# Patient Record
Sex: Female | Born: 1960 | Race: White | Hispanic: No | Marital: Married | Smoking: Never smoker
Health system: Southern US, Community
[De-identification: ages and names within clinical notes are randomized; demographics above are authoritative.]

## PROBLEM LIST (undated history)

## (undated) DIAGNOSIS — M791 Myalgia, unspecified site: Secondary | ICD-10-CM

## (undated) DIAGNOSIS — F32A Depression, unspecified: Secondary | ICD-10-CM

## (undated) DIAGNOSIS — Z9889 Other specified postprocedural states: Secondary | ICD-10-CM

## (undated) DIAGNOSIS — F329 Major depressive disorder, single episode, unspecified: Secondary | ICD-10-CM

## (undated) DIAGNOSIS — R61 Generalized hyperhidrosis: Secondary | ICD-10-CM

## (undated) DIAGNOSIS — R5383 Other fatigue: Secondary | ICD-10-CM

## (undated) DIAGNOSIS — R233 Spontaneous ecchymoses: Secondary | ICD-10-CM

## (undated) DIAGNOSIS — R238 Other skin changes: Secondary | ICD-10-CM

## (undated) DIAGNOSIS — R609 Edema, unspecified: Secondary | ICD-10-CM

## (undated) HISTORY — DX: Spontaneous ecchymoses: R23.3

## (undated) HISTORY — DX: Edema, unspecified: R60.9

## (undated) HISTORY — DX: Generalized hyperhidrosis: R61

## (undated) HISTORY — DX: Other specified postprocedural states: Z98.890

## (undated) HISTORY — DX: Myalgia, unspecified site: M79.10

## (undated) HISTORY — DX: Depression, unspecified: F32.A

## (undated) HISTORY — PX: BREAST SURGERY: SHX581

## (undated) HISTORY — PX: OTHER SURGICAL HISTORY: SHX169

## (undated) HISTORY — PX: BREAST EXCISIONAL BIOPSY: SUR124

## (undated) HISTORY — DX: Other fatigue: R53.83

## (undated) HISTORY — DX: Other skin changes: R23.8

## (undated) HISTORY — DX: Major depressive disorder, single episode, unspecified: F32.9

---

## 2003-07-07 ENCOUNTER — Encounter (HOSPITAL_COMMUNITY): Admission: RE | Admit: 2003-07-07 | Discharge: 2003-10-05 | Payer: Self-pay | Admitting: Unknown Physician Specialty

## 2003-07-07 ENCOUNTER — Encounter: Payer: Self-pay | Admitting: Unknown Physician Specialty

## 2003-07-08 ENCOUNTER — Encounter: Payer: Self-pay | Admitting: Unknown Physician Specialty

## 2003-07-08 ENCOUNTER — Encounter: Admission: RE | Admit: 2003-07-08 | Discharge: 2003-07-08 | Payer: Self-pay | Admitting: Unknown Physician Specialty

## 2003-08-26 ENCOUNTER — Encounter: Payer: Self-pay | Admitting: Unknown Physician Specialty

## 2004-08-02 ENCOUNTER — Encounter: Admission: RE | Admit: 2004-08-02 | Discharge: 2004-08-02 | Payer: Self-pay | Admitting: Obstetrics and Gynecology

## 2005-09-15 ENCOUNTER — Encounter: Admission: RE | Admit: 2005-09-15 | Discharge: 2005-09-15 | Payer: Self-pay | Admitting: Obstetrics and Gynecology

## 2006-11-02 ENCOUNTER — Encounter: Admission: RE | Admit: 2006-11-02 | Discharge: 2006-11-02 | Payer: Self-pay | Admitting: Obstetrics and Gynecology

## 2006-11-13 ENCOUNTER — Encounter: Admission: RE | Admit: 2006-11-13 | Discharge: 2006-11-13 | Payer: Self-pay | Admitting: Obstetrics and Gynecology

## 2008-06-17 ENCOUNTER — Encounter: Admission: RE | Admit: 2008-06-17 | Discharge: 2008-06-17 | Payer: Self-pay | Admitting: Obstetrics and Gynecology

## 2008-06-23 ENCOUNTER — Encounter: Admission: RE | Admit: 2008-06-23 | Discharge: 2008-06-23 | Payer: Self-pay | Admitting: Obstetrics and Gynecology

## 2010-08-17 ENCOUNTER — Encounter: Admission: RE | Admit: 2010-08-17 | Discharge: 2010-08-17 | Payer: Self-pay | Admitting: Obstetrics and Gynecology

## 2011-08-29 ENCOUNTER — Other Ambulatory Visit: Payer: Self-pay | Admitting: Obstetrics and Gynecology

## 2011-08-29 DIAGNOSIS — Z1231 Encounter for screening mammogram for malignant neoplasm of breast: Secondary | ICD-10-CM

## 2011-09-26 ENCOUNTER — Ambulatory Visit: Payer: Self-pay

## 2011-09-27 ENCOUNTER — Ambulatory Visit
Admission: RE | Admit: 2011-09-27 | Discharge: 2011-09-27 | Disposition: A | Discharge status: Home / Self Care | Payer: BC Managed Care – PPO | Source: Ambulatory Visit | Attending: Obstetrics and Gynecology | Admitting: Obstetrics and Gynecology

## 2011-09-27 DIAGNOSIS — Z1231 Encounter for screening mammogram for malignant neoplasm of breast: Secondary | ICD-10-CM

## 2012-09-20 ENCOUNTER — Other Ambulatory Visit: Payer: Self-pay | Admitting: Internal Medicine

## 2012-09-20 DIAGNOSIS — Z1231 Encounter for screening mammogram for malignant neoplasm of breast: Secondary | ICD-10-CM

## 2012-10-19 ENCOUNTER — Ambulatory Visit
Admission: RE | Admit: 2012-10-19 | Discharge: 2012-10-19 | Disposition: A | Discharge status: Home / Self Care | Payer: BC Managed Care – PPO | Source: Ambulatory Visit | Attending: Internal Medicine | Admitting: Internal Medicine

## 2012-10-19 DIAGNOSIS — Z1231 Encounter for screening mammogram for malignant neoplasm of breast: Secondary | ICD-10-CM

## 2013-09-19 ENCOUNTER — Encounter (INDEPENDENT_AMBULATORY_CARE_PROVIDER_SITE_OTHER): Payer: Self-pay

## 2013-09-19 ENCOUNTER — Ambulatory Visit (INDEPENDENT_AMBULATORY_CARE_PROVIDER_SITE_OTHER): Payer: BC Managed Care – PPO

## 2013-09-19 VITALS — BP 113/64 | HR 86 | Resp 16

## 2013-09-19 DIAGNOSIS — M792 Neuralgia and neuritis, unspecified: Secondary | ICD-10-CM

## 2013-09-19 DIAGNOSIS — IMO0002 Reserved for concepts with insufficient information to code with codable children: Secondary | ICD-10-CM

## 2013-09-19 DIAGNOSIS — R609 Edema, unspecified: Secondary | ICD-10-CM

## 2013-09-19 NOTE — Patient Instructions (Signed)
Edema Edema is an abnormal build-up of fluids in tissues. Because this is partly dependent on gravity (water flows to the lowest place), it is more common in the legs and thighs (lower extremities). It is also common in the looser tissues, like around the eyes. Painless swelling of the feet and ankles is common and increases as a person ages. It may affect both legs and may include the calves or even thighs. When squeezed, the fluid may move out of the affected area and may leave a dent for a few moments. CAUSES   Prolonged standing or sitting in one place for extended periods of time. Movement helps pump tissue fluid into the veins, and absence of movement prevents this, resulting in edema.  Varicose veins. The valves in the veins do not work as well as they should. This causes fluid to leak into the tissues.  Fluid and salt overload.  Injury, burn, or surgery to the leg, ankle, or foot, may damage veins and allow fluid to leak out.  Sunburn damages vessels. Leaky vessels allow fluid to go out into the sunburned tissues.  Allergies (from insect bites or stings, medications or chemicals) cause swelling by allowing vessels to become leaky.  Protein in the blood helps keep fluid in your vessels. Low protein, as in malnutrition, allows fluid to leak out.  Hormonal changes, including pregnancy and menstruation, cause fluid retention. This fluid may leak out of vessels and cause edema.  Medications that cause fluid retention. Examples are sex hormones, blood pressure medications, steroid treatment, or anti-depressants.  Some illnesses cause edema, especially heart failure, kidney disease, or liver disease.  Surgery that cuts veins or lymph nodes, such as surgery done for the heart or for breast cancer, may result in edema. DIAGNOSIS  Your caregiver is usually easily able to determine what is causing your swelling (edema) by simply asking what is wrong (getting a history) and examining you (doing  a physical). Sometimes x-rays, EKG (electrocardiogram or heart tracing), and blood work may be done to evaluate for underlying medical illness. TREATMENT  General treatment includes:  Leg elevation (or elevation of the affected body part).  Restriction of fluid intake.  Prevention of fluid overload.  Compression of the affected body part. Compression with elastic bandages or support stockings squeezes the tissues, preventing fluid from entering and forcing it back into the blood vessels.  Diuretics (also called water pills or fluid pills) pull fluid out of your body in the form of increased urination. These are effective in reducing the swelling, but can have side effects and must be used only under your caregiver's supervision. Diuretics are appropriate only for some types of edema. The specific treatment can be directed at any underlying causes discovered. Heart, liver, or kidney disease should be treated appropriately. HOME CARE INSTRUCTIONS   Elevate the legs (or affected body part) above the level of the heart, while lying down.  Avoid sitting or standing still for prolonged periods of time.  Avoid putting anything directly under the knees when lying down, and do not wear constricting clothing or garters on the upper legs.  Exercising the legs causes the fluid to work back into the veins and lymphatic channels. This may help the swelling go down.  The pressure applied by elastic bandages or support stockings can help reduce ankle swelling.  A low-salt diet may help reduce fluid retention and decrease the ankle swelling.  Take any medications exactly as prescribed. SEEK MEDICAL CARE IF:  Your edema is   not responding to recommended treatments. SEEK IMMEDIATE MEDICAL CARE IF:   You develop shortness of breath or chest pain.  You cannot breathe when you lay down; or if, while lying down, you have to get up and go to the window to get your breath.  You are having increasing  swelling without relief from treatment.  You develop a fever over 102 F (38.9 C).  You develop pain or redness in the areas that are swollen.  Tell your caregiver right away if you have gained 03 lb/1.4 kg in 1 day or 05 lb/2.3 kg in a week. MAKE SURE YOU:   Understand these instructions.  Will watch your condition.  Will get help right away if you are not doing well or get worse. Document Released: 10/24/2005 Document Revised: 04/24/2012 Document Reviewed: 06/11/2008 [employer] Patient Information 2014 [employer]  

## 2013-09-19 NOTE — Progress Notes (Signed)
  Subjective:    Patient ID: Lauren Rojas, female    DOB: [birth year], 52 y.o.   MRN: [id number]  HPI the cat bite back in august and it was in my ankle and shooting pains and went to dr Tomasa Blase and he gave me an antibotic and then went to the ER and they did IV antibotics and they did x-rays    Review of Systems  Constitutional: Negative.   HENT: Negative.   Eyes: Negative.   Respiratory: Negative.   Cardiovascular: Negative.   Gastrointestinal: Negative.   Endocrine: Negative.   Genitourinary: Negative.   Musculoskeletal:       Muscle pain right foot and joint pain  Skin: Negative.   Allergic/Immunologic: Negative.   Neurological: Negative.   Hematological: Bruises/bleeds easily.  Psychiatric/Behavioral: Negative.        Objective:   Physical Exam Neurovascular status as follows pedal pulses palpable DP postal for PT postop over 4 bilateral. Refill time 3 seconds all digits. Skin temperature warm bilateral. Turgor normal no edema rubor pallor or varicosities noted. At most likely some slight edema of the anteromedial ankle right with prolonged work activities patient did have a cat bite and the anteromedial right ankle pictures on her phone were shown to me indicating there is a large amount of cellulitis present at the time there is no evidence of scar or cellulitis of present time no discoloration no change in temperature. On palpation the talonavicular joint has some slight tenderness and prominence. Slightly more prominent than the contralateral left foot. Patient declined any x-rays at this time is had 2 x-rays taken previously with a fairly no evidence of bone infection. Orthopedic biomechanical exam otherwise unremarkable rectus hallux and digits are noted good range of motion dorsiflexion plantar flexion of all digits foot and ankle. Patient is a history of back surgeries x2 which may be providing some contributory nerve symptomology.    Assessment & Plan:  History of trauma  and infection with localized cellulitis to the right ankle. The cellulitis appears to be completely resolved at this time no residual signs of infection. Cannot rule out some residual edema posse mild osteoarthropathy patient cases she does have some arthritis in her great toe joint. At this time cannot rule out some paresthesia or neuritis or neuralgia secondary to the local cellulitis. Patient denies this may be like phantom pain following a long term infection or trauma. Suggested warm compresses ice pack continue using her current medications. Also recommended support hose or compression stockings to help reduce edema which may also reduce nerve compression. Suggested followup in 6 months if symptoms were to increase or persist. If they were to persist consider additional studies either MRI or nerve conduction studies have to be done  Alvan Dame DPM

## 2013-10-08 ENCOUNTER — Other Ambulatory Visit: Payer: Self-pay

## 2013-10-08 DIAGNOSIS — Z1231 Encounter for screening mammogram for malignant neoplasm of breast: Secondary | ICD-10-CM

## 2013-10-23 ENCOUNTER — Ambulatory Visit: Payer: BC Managed Care – PPO

## 2013-11-01 ENCOUNTER — Ambulatory Visit: Payer: BC Managed Care – PPO

## 2013-11-04 ENCOUNTER — Ambulatory Visit
Admission: RE | Admit: 2013-11-04 | Discharge: 2013-11-04 | Disposition: A | Discharge status: Home / Self Care | Payer: BC Managed Care – PPO | Source: Ambulatory Visit

## 2013-11-04 DIAGNOSIS — Z1231 Encounter for screening mammogram for malignant neoplasm of breast: Secondary | ICD-10-CM

## 2013-11-13 ENCOUNTER — Other Ambulatory Visit: Payer: Self-pay | Admitting: Internal Medicine

## 2013-11-13 DIAGNOSIS — R928 Other abnormal and inconclusive findings on diagnostic imaging of breast: Secondary | ICD-10-CM

## 2013-11-20 ENCOUNTER — Ambulatory Visit
Admission: RE | Admit: 2013-11-20 | Discharge: 2013-11-20 | Disposition: A | Discharge status: Home / Self Care | Payer: BC Managed Care – PPO | Source: Ambulatory Visit | Attending: Internal Medicine | Admitting: Internal Medicine

## 2013-11-20 DIAGNOSIS — R928 Other abnormal and inconclusive findings on diagnostic imaging of breast: Secondary | ICD-10-CM

## 2015-05-19 ENCOUNTER — Other Ambulatory Visit: Payer: Self-pay

## 2015-05-19 DIAGNOSIS — Z1231 Encounter for screening mammogram for malignant neoplasm of breast: Secondary | ICD-10-CM

## 2015-06-25 ENCOUNTER — Ambulatory Visit
Admission: RE | Admit: 2015-06-25 | Discharge: 2015-06-25 | Disposition: A | Discharge status: Home / Self Care | Payer: BLUE CROSS/BLUE SHIELD | Source: Ambulatory Visit

## 2015-06-25 DIAGNOSIS — Z1231 Encounter for screening mammogram for malignant neoplasm of breast: Secondary | ICD-10-CM

## 2016-08-17 DIAGNOSIS — R5382 Chronic fatigue, unspecified: Secondary | ICD-10-CM | POA: Diagnosis not present

## 2016-08-17 DIAGNOSIS — E559 Vitamin D deficiency, unspecified: Secondary | ICD-10-CM | POA: Diagnosis not present

## 2016-08-17 DIAGNOSIS — E785 Hyperlipidemia, unspecified: Secondary | ICD-10-CM | POA: Diagnosis not present

## 2016-08-17 DIAGNOSIS — D509 Iron deficiency anemia, unspecified: Secondary | ICD-10-CM | POA: Diagnosis not present

## 2016-08-17 DIAGNOSIS — Z6836 Body mass index (BMI) 36.0-36.9, adult: Secondary | ICD-10-CM | POA: Diagnosis not present

## 2016-08-29 DIAGNOSIS — Z6835 Body mass index (BMI) 35.0-35.9, adult: Secondary | ICD-10-CM | POA: Diagnosis not present

## 2016-08-29 DIAGNOSIS — E669 Obesity, unspecified: Secondary | ICD-10-CM | POA: Diagnosis not present

## 2016-08-29 DIAGNOSIS — J208 Acute bronchitis due to other specified organisms: Secondary | ICD-10-CM | POA: Diagnosis not present

## 2016-09-07 ENCOUNTER — Other Ambulatory Visit: Payer: Self-pay | Admitting: Internal Medicine

## 2016-09-07 DIAGNOSIS — Z803 Family history of malignant neoplasm of breast: Secondary | ICD-10-CM

## 2016-09-07 DIAGNOSIS — Z1231 Encounter for screening mammogram for malignant neoplasm of breast: Secondary | ICD-10-CM

## 2016-10-13 ENCOUNTER — Ambulatory Visit
Admission: RE | Admit: 2016-10-13 | Discharge: 2016-10-13 | Disposition: A | Discharge status: Home / Self Care | Payer: BLUE CROSS/BLUE SHIELD | Source: Ambulatory Visit | Attending: Internal Medicine | Admitting: Internal Medicine

## 2016-10-13 DIAGNOSIS — Z1231 Encounter for screening mammogram for malignant neoplasm of breast: Secondary | ICD-10-CM | POA: Diagnosis not present

## 2016-10-13 DIAGNOSIS — Z803 Family history of malignant neoplasm of breast: Secondary | ICD-10-CM

## 2016-12-15 DIAGNOSIS — Z6835 Body mass index (BMI) 35.0-35.9, adult: Secondary | ICD-10-CM | POA: Diagnosis not present

## 2016-12-15 DIAGNOSIS — J208 Acute bronchitis due to other specified organisms: Secondary | ICD-10-CM | POA: Diagnosis not present

## 2016-12-15 DIAGNOSIS — B029 Zoster without complications: Secondary | ICD-10-CM | POA: Diagnosis not present

## 2017-03-31 DIAGNOSIS — J208 Acute bronchitis due to other specified organisms: Secondary | ICD-10-CM | POA: Diagnosis not present

## 2017-03-31 DIAGNOSIS — D509 Iron deficiency anemia, unspecified: Secondary | ICD-10-CM | POA: Diagnosis not present

## 2017-03-31 DIAGNOSIS — Z6835 Body mass index (BMI) 35.0-35.9, adult: Secondary | ICD-10-CM | POA: Diagnosis not present

## 2017-03-31 DIAGNOSIS — R000 Tachycardia, unspecified: Secondary | ICD-10-CM | POA: Diagnosis not present

## 2017-03-31 DIAGNOSIS — R5383 Other fatigue: Secondary | ICD-10-CM | POA: Diagnosis not present

## 2017-09-05 ENCOUNTER — Other Ambulatory Visit: Payer: Self-pay | Admitting: Internal Medicine

## 2017-09-05 DIAGNOSIS — Z1231 Encounter for screening mammogram for malignant neoplasm of breast: Secondary | ICD-10-CM

## 2017-09-18 DIAGNOSIS — M9901 Segmental and somatic dysfunction of cervical region: Secondary | ICD-10-CM | POA: Diagnosis not present

## 2017-09-18 DIAGNOSIS — M542 Cervicalgia: Secondary | ICD-10-CM | POA: Diagnosis not present

## 2017-09-18 DIAGNOSIS — M9905 Segmental and somatic dysfunction of pelvic region: Secondary | ICD-10-CM | POA: Diagnosis not present

## 2017-09-18 DIAGNOSIS — M9902 Segmental and somatic dysfunction of thoracic region: Secondary | ICD-10-CM | POA: Diagnosis not present

## 2017-10-10 DIAGNOSIS — Z01419 Encounter for gynecological examination (general) (routine) without abnormal findings: Secondary | ICD-10-CM | POA: Diagnosis not present

## 2017-10-16 ENCOUNTER — Ambulatory Visit: Payer: BLUE CROSS/BLUE SHIELD

## 2017-12-22 DIAGNOSIS — J019 Acute sinusitis, unspecified: Secondary | ICD-10-CM | POA: Diagnosis not present

## 2017-12-22 DIAGNOSIS — Z6837 Body mass index (BMI) 37.0-37.9, adult: Secondary | ICD-10-CM | POA: Diagnosis not present

## 2017-12-28 DIAGNOSIS — R110 Nausea: Secondary | ICD-10-CM | POA: Diagnosis not present

## 2017-12-28 DIAGNOSIS — J208 Acute bronchitis due to other specified organisms: Secondary | ICD-10-CM | POA: Diagnosis not present

## 2017-12-28 DIAGNOSIS — Z6836 Body mass index (BMI) 36.0-36.9, adult: Secondary | ICD-10-CM | POA: Diagnosis not present

## 2017-12-29 DIAGNOSIS — J209 Acute bronchitis, unspecified: Secondary | ICD-10-CM | POA: Diagnosis not present

## 2017-12-29 DIAGNOSIS — R911 Solitary pulmonary nodule: Secondary | ICD-10-CM | POA: Diagnosis not present

## 2017-12-29 DIAGNOSIS — J208 Acute bronchitis due to other specified organisms: Secondary | ICD-10-CM | POA: Diagnosis not present

## 2018-01-01 DIAGNOSIS — R911 Solitary pulmonary nodule: Secondary | ICD-10-CM | POA: Diagnosis not present

## 2018-04-09 DIAGNOSIS — M542 Cervicalgia: Secondary | ICD-10-CM | POA: Diagnosis not present

## 2018-04-09 DIAGNOSIS — M9901 Segmental and somatic dysfunction of cervical region: Secondary | ICD-10-CM | POA: Diagnosis not present

## 2018-04-09 DIAGNOSIS — M9902 Segmental and somatic dysfunction of thoracic region: Secondary | ICD-10-CM | POA: Diagnosis not present

## 2018-04-09 DIAGNOSIS — M9905 Segmental and somatic dysfunction of pelvic region: Secondary | ICD-10-CM | POA: Diagnosis not present

## 2018-05-15 DIAGNOSIS — R918 Other nonspecific abnormal finding of lung field: Secondary | ICD-10-CM | POA: Diagnosis not present

## 2018-05-15 DIAGNOSIS — J019 Acute sinusitis, unspecified: Secondary | ICD-10-CM | POA: Diagnosis not present

## 2018-05-15 DIAGNOSIS — R590 Localized enlarged lymph nodes: Secondary | ICD-10-CM | POA: Diagnosis not present

## 2018-05-25 ENCOUNTER — Other Ambulatory Visit: Payer: Self-pay | Admitting: Internal Medicine

## 2018-05-25 DIAGNOSIS — R918 Other nonspecific abnormal finding of lung field: Secondary | ICD-10-CM

## 2018-06-01 ENCOUNTER — Other Ambulatory Visit: Payer: BLUE CROSS/BLUE SHIELD

## 2018-06-06 ENCOUNTER — Other Ambulatory Visit: Payer: BLUE CROSS/BLUE SHIELD

## 2018-06-06 ENCOUNTER — Ambulatory Visit
Admission: RE | Admit: 2018-06-06 | Discharge: 2018-06-06 | Disposition: A | Discharge status: Home / Self Care | Payer: BLUE CROSS/BLUE SHIELD | Source: Ambulatory Visit | Attending: Internal Medicine | Admitting: Internal Medicine

## 2018-06-06 DIAGNOSIS — R918 Other nonspecific abnormal finding of lung field: Secondary | ICD-10-CM

## 2018-06-06 MED ORDER — IOPAMIDOL (ISOVUE-300) INJECTION 61%
75.0000 mL | Freq: Once | INTRAVENOUS | Status: AC | PRN
  Administered 2018-06-06: 75 mL via INTRAVENOUS

## 2018-06-12 DIAGNOSIS — R590 Localized enlarged lymph nodes: Secondary | ICD-10-CM | POA: Diagnosis not present

## 2018-06-13 ENCOUNTER — Ambulatory Visit
Admission: RE | Admit: 2018-06-13 | Discharge: 2018-06-13 | Disposition: A | Discharge status: Home / Self Care | Payer: BLUE CROSS/BLUE SHIELD | Source: Ambulatory Visit | Attending: Internal Medicine | Admitting: Internal Medicine

## 2018-06-13 DIAGNOSIS — Z1231 Encounter for screening mammogram for malignant neoplasm of breast: Secondary | ICD-10-CM | POA: Diagnosis not present

## 2018-08-07 DIAGNOSIS — M8589 Other specified disorders of bone density and structure, multiple sites: Secondary | ICD-10-CM | POA: Diagnosis not present

## 2018-08-07 DIAGNOSIS — Z1331 Encounter for screening for depression: Secondary | ICD-10-CM | POA: Diagnosis not present

## 2018-08-07 DIAGNOSIS — F329 Major depressive disorder, single episode, unspecified: Secondary | ICD-10-CM | POA: Diagnosis not present

## 2018-08-07 DIAGNOSIS — Z0000 Encounter for general adult medical examination without abnormal findings: Secondary | ICD-10-CM | POA: Diagnosis not present

## 2018-10-26 DIAGNOSIS — R51 Headache: Secondary | ICD-10-CM | POA: Diagnosis not present

## 2018-10-26 DIAGNOSIS — E785 Hyperlipidemia, unspecified: Secondary | ICD-10-CM | POA: Diagnosis not present

## 2018-10-26 DIAGNOSIS — K219 Gastro-esophageal reflux disease without esophagitis: Secondary | ICD-10-CM | POA: Diagnosis not present

## 2018-10-26 DIAGNOSIS — K529 Noninfective gastroenteritis and colitis, unspecified: Secondary | ICD-10-CM | POA: Diagnosis not present

## 2018-10-26 DIAGNOSIS — Z6834 Body mass index (BMI) 34.0-34.9, adult: Secondary | ICD-10-CM | POA: Diagnosis not present

## 2018-10-26 DIAGNOSIS — R112 Nausea with vomiting, unspecified: Secondary | ICD-10-CM | POA: Diagnosis not present

## 2018-12-17 DIAGNOSIS — Z1211 Encounter for screening for malignant neoplasm of colon: Secondary | ICD-10-CM | POA: Diagnosis not present

## 2019-03-18 DIAGNOSIS — S51859A Open bite of unspecified forearm, initial encounter: Secondary | ICD-10-CM | POA: Diagnosis not present

## 2019-05-06 DIAGNOSIS — M9905 Segmental and somatic dysfunction of pelvic region: Secondary | ICD-10-CM | POA: Diagnosis not present

## 2019-05-06 DIAGNOSIS — M542 Cervicalgia: Secondary | ICD-10-CM | POA: Diagnosis not present

## 2019-05-06 DIAGNOSIS — M9902 Segmental and somatic dysfunction of thoracic region: Secondary | ICD-10-CM | POA: Diagnosis not present

## 2019-05-06 DIAGNOSIS — M9901 Segmental and somatic dysfunction of cervical region: Secondary | ICD-10-CM | POA: Diagnosis not present

## 2019-08-01 ENCOUNTER — Other Ambulatory Visit: Payer: Self-pay | Admitting: Internal Medicine

## 2019-08-01 DIAGNOSIS — Z1231 Encounter for screening mammogram for malignant neoplasm of breast: Secondary | ICD-10-CM

## 2019-08-02 DIAGNOSIS — M542 Cervicalgia: Secondary | ICD-10-CM | POA: Diagnosis not present

## 2019-08-02 DIAGNOSIS — M9902 Segmental and somatic dysfunction of thoracic region: Secondary | ICD-10-CM | POA: Diagnosis not present

## 2019-08-02 DIAGNOSIS — M9905 Segmental and somatic dysfunction of pelvic region: Secondary | ICD-10-CM | POA: Diagnosis not present

## 2019-08-02 DIAGNOSIS — M9901 Segmental and somatic dysfunction of cervical region: Secondary | ICD-10-CM | POA: Diagnosis not present

## 2019-08-23 DIAGNOSIS — M545 Low back pain: Secondary | ICD-10-CM | POA: Diagnosis not present

## 2019-08-27 DIAGNOSIS — M545 Low back pain: Secondary | ICD-10-CM | POA: Diagnosis not present

## 2019-08-27 DIAGNOSIS — M546 Pain in thoracic spine: Secondary | ICD-10-CM | POA: Diagnosis not present

## 2019-08-28 DIAGNOSIS — M546 Pain in thoracic spine: Secondary | ICD-10-CM | POA: Diagnosis not present

## 2019-08-28 DIAGNOSIS — M545 Low back pain: Secondary | ICD-10-CM | POA: Diagnosis not present

## 2019-08-28 DIAGNOSIS — M47816 Spondylosis without myelopathy or radiculopathy, lumbar region: Secondary | ICD-10-CM | POA: Diagnosis not present

## 2019-08-29 DIAGNOSIS — M545 Low back pain: Secondary | ICD-10-CM | POA: Diagnosis not present

## 2019-08-29 DIAGNOSIS — M6281 Muscle weakness (generalized): Secondary | ICD-10-CM | POA: Diagnosis not present

## 2019-08-29 DIAGNOSIS — M4727 Other spondylosis with radiculopathy, lumbosacral region: Secondary | ICD-10-CM | POA: Diagnosis not present

## 2019-08-30 DIAGNOSIS — M6281 Muscle weakness (generalized): Secondary | ICD-10-CM | POA: Diagnosis not present

## 2019-08-30 DIAGNOSIS — M542 Cervicalgia: Secondary | ICD-10-CM | POA: Diagnosis not present

## 2019-08-30 DIAGNOSIS — M4727 Other spondylosis with radiculopathy, lumbosacral region: Secondary | ICD-10-CM | POA: Diagnosis not present

## 2019-08-30 DIAGNOSIS — M9905 Segmental and somatic dysfunction of pelvic region: Secondary | ICD-10-CM | POA: Diagnosis not present

## 2019-08-30 DIAGNOSIS — M9901 Segmental and somatic dysfunction of cervical region: Secondary | ICD-10-CM | POA: Diagnosis not present

## 2019-08-30 DIAGNOSIS — M9902 Segmental and somatic dysfunction of thoracic region: Secondary | ICD-10-CM | POA: Diagnosis not present

## 2019-08-30 DIAGNOSIS — M545 Low back pain: Secondary | ICD-10-CM | POA: Diagnosis not present

## 2019-09-02 DIAGNOSIS — M4727 Other spondylosis with radiculopathy, lumbosacral region: Secondary | ICD-10-CM | POA: Diagnosis not present

## 2019-09-02 DIAGNOSIS — M6281 Muscle weakness (generalized): Secondary | ICD-10-CM | POA: Diagnosis not present

## 2019-09-02 DIAGNOSIS — M545 Low back pain: Secondary | ICD-10-CM | POA: Diagnosis not present

## 2019-09-04 DIAGNOSIS — M4727 Other spondylosis with radiculopathy, lumbosacral region: Secondary | ICD-10-CM | POA: Diagnosis not present

## 2019-09-04 DIAGNOSIS — M6281 Muscle weakness (generalized): Secondary | ICD-10-CM | POA: Diagnosis not present

## 2019-09-04 DIAGNOSIS — M545 Low back pain: Secondary | ICD-10-CM | POA: Diagnosis not present

## 2019-09-11 DIAGNOSIS — M545 Low back pain: Secondary | ICD-10-CM | POA: Diagnosis not present

## 2019-09-11 DIAGNOSIS — M6281 Muscle weakness (generalized): Secondary | ICD-10-CM | POA: Diagnosis not present

## 2019-09-11 DIAGNOSIS — M4727 Other spondylosis with radiculopathy, lumbosacral region: Secondary | ICD-10-CM | POA: Diagnosis not present

## 2019-09-12 DIAGNOSIS — M5416 Radiculopathy, lumbar region: Secondary | ICD-10-CM | POA: Diagnosis not present

## 2019-09-16 ENCOUNTER — Ambulatory Visit
Admission: RE | Admit: 2019-09-16 | Discharge: 2019-09-16 | Disposition: A | Discharge status: Home / Self Care | Payer: BC Managed Care – PPO | Source: Ambulatory Visit | Attending: Internal Medicine | Admitting: Internal Medicine

## 2019-09-16 ENCOUNTER — Other Ambulatory Visit: Payer: Self-pay

## 2019-09-16 DIAGNOSIS — Z1231 Encounter for screening mammogram for malignant neoplasm of breast: Secondary | ICD-10-CM | POA: Diagnosis not present

## 2019-09-30 DIAGNOSIS — M4727 Other spondylosis with radiculopathy, lumbosacral region: Secondary | ICD-10-CM | POA: Diagnosis not present

## 2019-09-30 DIAGNOSIS — M5442 Lumbago with sciatica, left side: Secondary | ICD-10-CM | POA: Diagnosis not present

## 2019-09-30 DIAGNOSIS — M5136 Other intervertebral disc degeneration, lumbar region: Secondary | ICD-10-CM | POA: Diagnosis not present

## 2019-09-30 DIAGNOSIS — M545 Low back pain: Secondary | ICD-10-CM | POA: Diagnosis not present

## 2019-09-30 DIAGNOSIS — M6281 Muscle weakness (generalized): Secondary | ICD-10-CM | POA: Diagnosis not present

## 2019-10-31 DIAGNOSIS — M545 Low back pain: Secondary | ICD-10-CM | POA: Diagnosis not present

## 2019-10-31 DIAGNOSIS — M6281 Muscle weakness (generalized): Secondary | ICD-10-CM | POA: Diagnosis not present

## 2019-10-31 DIAGNOSIS — M4727 Other spondylosis with radiculopathy, lumbosacral region: Secondary | ICD-10-CM | POA: Diagnosis not present

## 2019-11-12 DIAGNOSIS — M5117 Intervertebral disc disorders with radiculopathy, lumbosacral region: Secondary | ICD-10-CM | POA: Diagnosis not present

## 2019-11-12 DIAGNOSIS — Z9889 Other specified postprocedural states: Secondary | ICD-10-CM | POA: Diagnosis not present

## 2019-11-12 DIAGNOSIS — M5116 Intervertebral disc disorders with radiculopathy, lumbar region: Secondary | ICD-10-CM | POA: Diagnosis not present

## 2019-11-13 DIAGNOSIS — M9905 Segmental and somatic dysfunction of pelvic region: Secondary | ICD-10-CM | POA: Diagnosis not present

## 2019-11-13 DIAGNOSIS — M9902 Segmental and somatic dysfunction of thoracic region: Secondary | ICD-10-CM | POA: Diagnosis not present

## 2019-11-13 DIAGNOSIS — M542 Cervicalgia: Secondary | ICD-10-CM | POA: Diagnosis not present

## 2019-11-13 DIAGNOSIS — M9901 Segmental and somatic dysfunction of cervical region: Secondary | ICD-10-CM | POA: Diagnosis not present

## 2019-11-18 DIAGNOSIS — M48061 Spinal stenosis, lumbar region without neurogenic claudication: Secondary | ICD-10-CM | POA: Diagnosis not present

## 2019-11-18 DIAGNOSIS — M5136 Other intervertebral disc degeneration, lumbar region: Secondary | ICD-10-CM | POA: Diagnosis not present

## 2019-11-27 DIAGNOSIS — M5417 Radiculopathy, lumbosacral region: Secondary | ICD-10-CM | POA: Diagnosis not present

## 2019-12-01 DIAGNOSIS — M4727 Other spondylosis with radiculopathy, lumbosacral region: Secondary | ICD-10-CM | POA: Diagnosis not present

## 2019-12-01 DIAGNOSIS — M6281 Muscle weakness (generalized): Secondary | ICD-10-CM | POA: Diagnosis not present

## 2019-12-01 DIAGNOSIS — M545 Low back pain: Secondary | ICD-10-CM | POA: Diagnosis not present

## 2019-12-23 DIAGNOSIS — F419 Anxiety disorder, unspecified: Secondary | ICD-10-CM | POA: Diagnosis not present

## 2020-01-01 DIAGNOSIS — M6281 Muscle weakness (generalized): Secondary | ICD-10-CM | POA: Diagnosis not present

## 2020-01-01 DIAGNOSIS — M4727 Other spondylosis with radiculopathy, lumbosacral region: Secondary | ICD-10-CM | POA: Diagnosis not present

## 2020-01-01 DIAGNOSIS — M545 Low back pain: Secondary | ICD-10-CM | POA: Diagnosis not present

## 2020-01-28 DIAGNOSIS — M545 Low back pain: Secondary | ICD-10-CM | POA: Diagnosis not present

## 2020-01-28 DIAGNOSIS — M4727 Other spondylosis with radiculopathy, lumbosacral region: Secondary | ICD-10-CM | POA: Diagnosis not present

## 2020-01-28 DIAGNOSIS — M6281 Muscle weakness (generalized): Secondary | ICD-10-CM | POA: Diagnosis not present

## 2020-02-28 DIAGNOSIS — M4727 Other spondylosis with radiculopathy, lumbosacral region: Secondary | ICD-10-CM | POA: Diagnosis not present

## 2020-02-28 DIAGNOSIS — M545 Low back pain: Secondary | ICD-10-CM | POA: Diagnosis not present

## 2020-02-28 DIAGNOSIS — M6281 Muscle weakness (generalized): Secondary | ICD-10-CM | POA: Diagnosis not present

## 2020-03-29 DIAGNOSIS — M6281 Muscle weakness (generalized): Secondary | ICD-10-CM | POA: Diagnosis not present

## 2020-03-29 DIAGNOSIS — M545 Low back pain: Secondary | ICD-10-CM | POA: Diagnosis not present

## 2020-03-29 DIAGNOSIS — M4727 Other spondylosis with radiculopathy, lumbosacral region: Secondary | ICD-10-CM | POA: Diagnosis not present

## 2020-04-01 DIAGNOSIS — M9905 Segmental and somatic dysfunction of pelvic region: Secondary | ICD-10-CM | POA: Diagnosis not present

## 2020-04-01 DIAGNOSIS — M542 Cervicalgia: Secondary | ICD-10-CM | POA: Diagnosis not present

## 2020-04-01 DIAGNOSIS — M9902 Segmental and somatic dysfunction of thoracic region: Secondary | ICD-10-CM | POA: Diagnosis not present

## 2020-04-01 DIAGNOSIS — M9901 Segmental and somatic dysfunction of cervical region: Secondary | ICD-10-CM | POA: Diagnosis not present

## 2020-04-29 DIAGNOSIS — M4727 Other spondylosis with radiculopathy, lumbosacral region: Secondary | ICD-10-CM | POA: Diagnosis not present

## 2020-04-29 DIAGNOSIS — M545 Low back pain: Secondary | ICD-10-CM | POA: Diagnosis not present

## 2020-04-29 DIAGNOSIS — M6281 Muscle weakness (generalized): Secondary | ICD-10-CM | POA: Diagnosis not present

## 2020-05-14 DIAGNOSIS — R918 Other nonspecific abnormal finding of lung field: Secondary | ICD-10-CM | POA: Diagnosis not present

## 2020-05-14 DIAGNOSIS — B372 Candidiasis of skin and nail: Secondary | ICD-10-CM | POA: Diagnosis not present

## 2020-05-14 DIAGNOSIS — R590 Localized enlarged lymph nodes: Secondary | ICD-10-CM | POA: Diagnosis not present

## 2020-05-29 DIAGNOSIS — M6281 Muscle weakness (generalized): Secondary | ICD-10-CM | POA: Diagnosis not present

## 2020-05-29 DIAGNOSIS — M4727 Other spondylosis with radiculopathy, lumbosacral region: Secondary | ICD-10-CM | POA: Diagnosis not present

## 2020-05-29 DIAGNOSIS — M545 Low back pain: Secondary | ICD-10-CM | POA: Diagnosis not present

## 2020-06-04 DIAGNOSIS — R918 Other nonspecific abnormal finding of lung field: Secondary | ICD-10-CM | POA: Diagnosis not present

## 2020-08-12 ENCOUNTER — Other Ambulatory Visit: Payer: Self-pay | Admitting: Internal Medicine

## 2020-08-12 DIAGNOSIS — Z1231 Encounter for screening mammogram for malignant neoplasm of breast: Secondary | ICD-10-CM

## 2020-09-16 ENCOUNTER — Ambulatory Visit: Payer: BC Managed Care – PPO

## 2020-10-23 ENCOUNTER — Ambulatory Visit: Payer: Self-pay

## 2020-12-02 ENCOUNTER — Ambulatory Visit
Admission: RE | Admit: 2020-12-02 | Discharge: 2020-12-02 | Disposition: A | Discharge status: Home / Self Care | Payer: BLUE CROSS/BLUE SHIELD | Source: Ambulatory Visit | Attending: Internal Medicine | Admitting: Internal Medicine

## 2020-12-02 ENCOUNTER — Other Ambulatory Visit: Payer: Self-pay

## 2020-12-02 DIAGNOSIS — Z1231 Encounter for screening mammogram for malignant neoplasm of breast: Secondary | ICD-10-CM

## 2021-01-27 IMAGING — MG DIGITAL SCREENING BILAT W/ TOMO W/ CAD
8 series · 8 of 24 positions shown · non-contrast
Comparison: Previous exam(s).

CLINICAL DATA: Screening.

EXAM:
DIGITAL SCREENING BILATERAL MAMMOGRAM WITH TOMO AND CAD

[L MLO synth-2D]
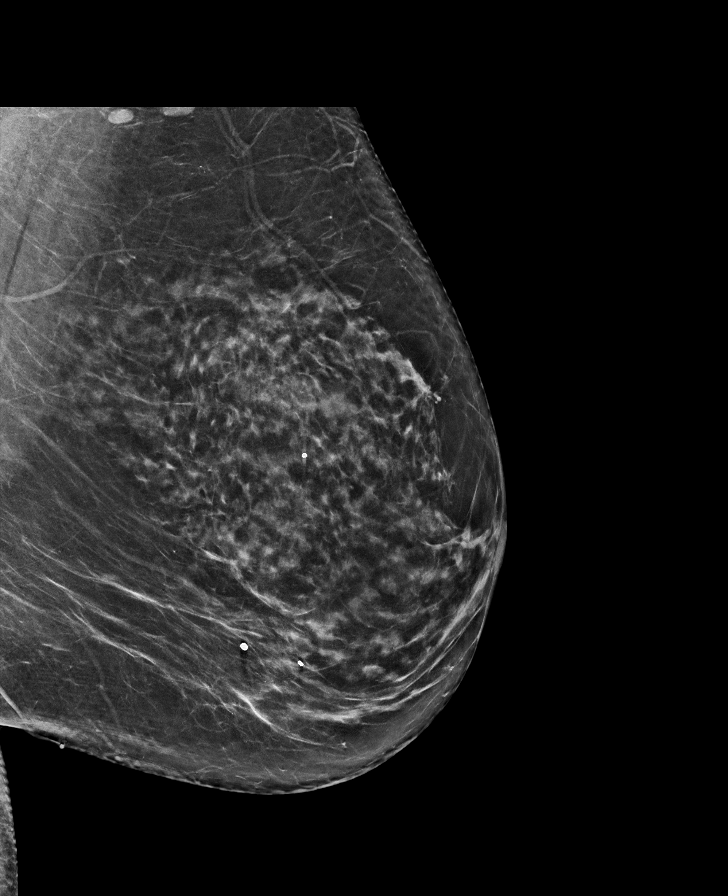

[L CC synth-2D]
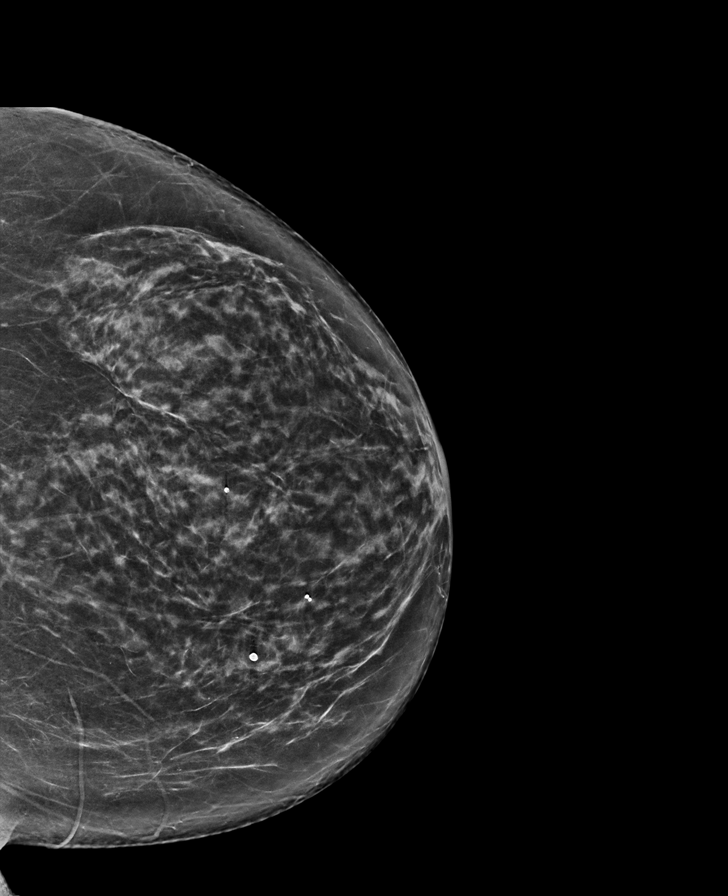

[R MLO synth-2D]
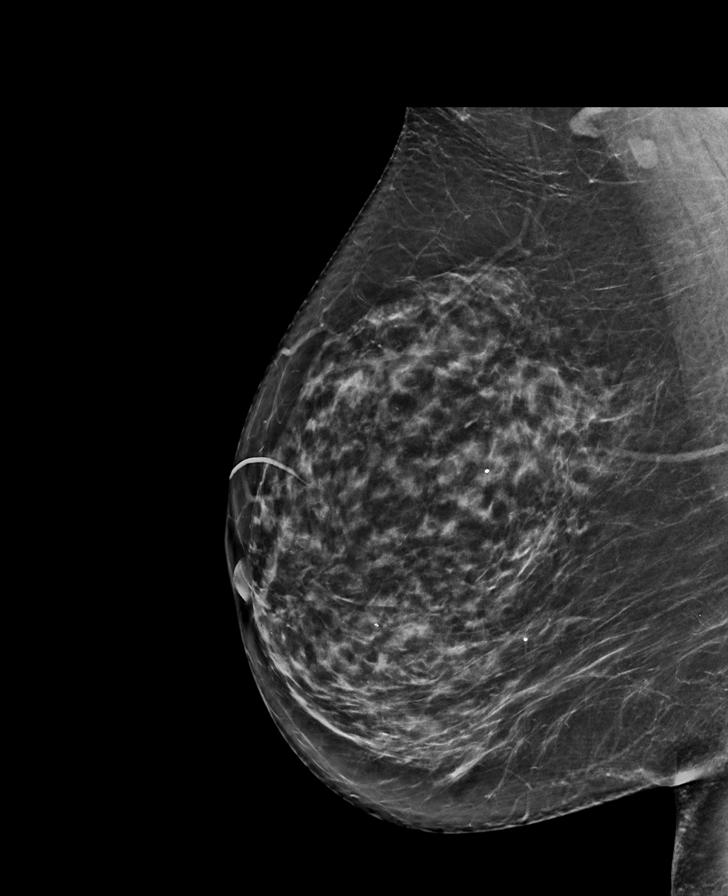

[R CC synth-2D]
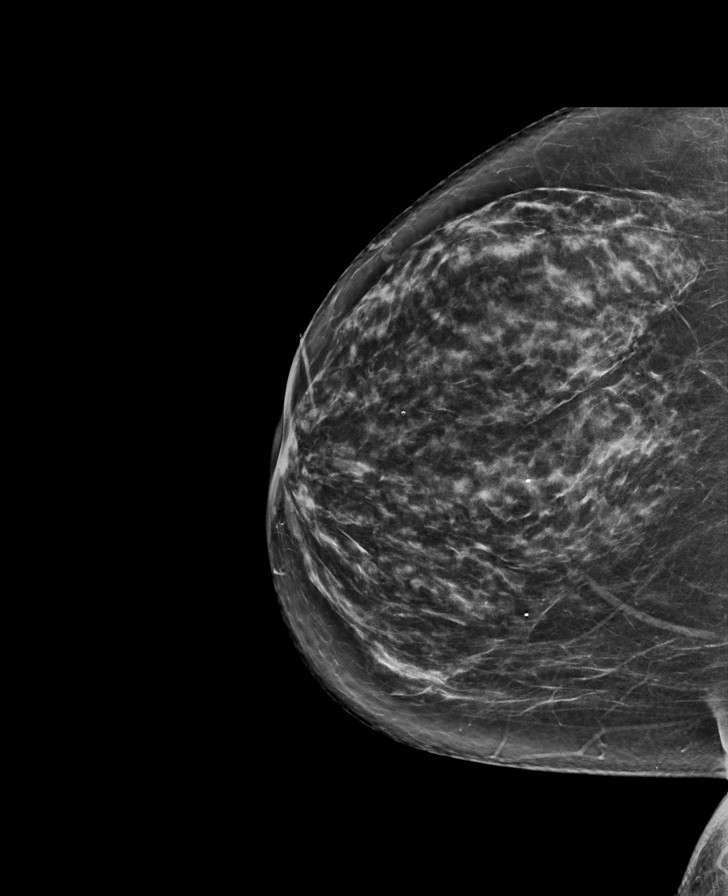

[L MLO tomo · tomo slice 40/79.0]
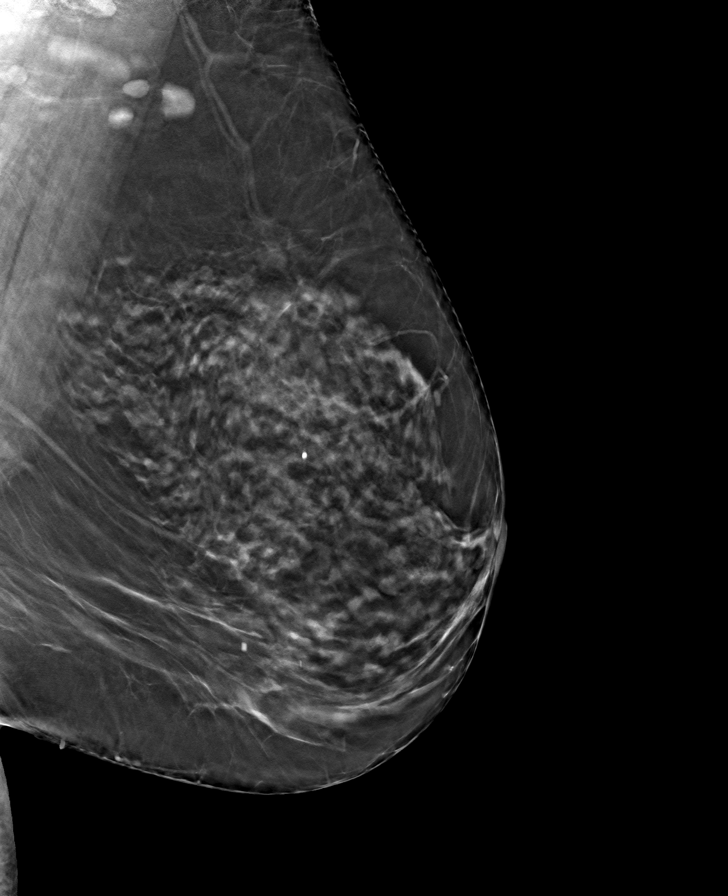

[R MLO tomo · tomo slice 40/79.0]
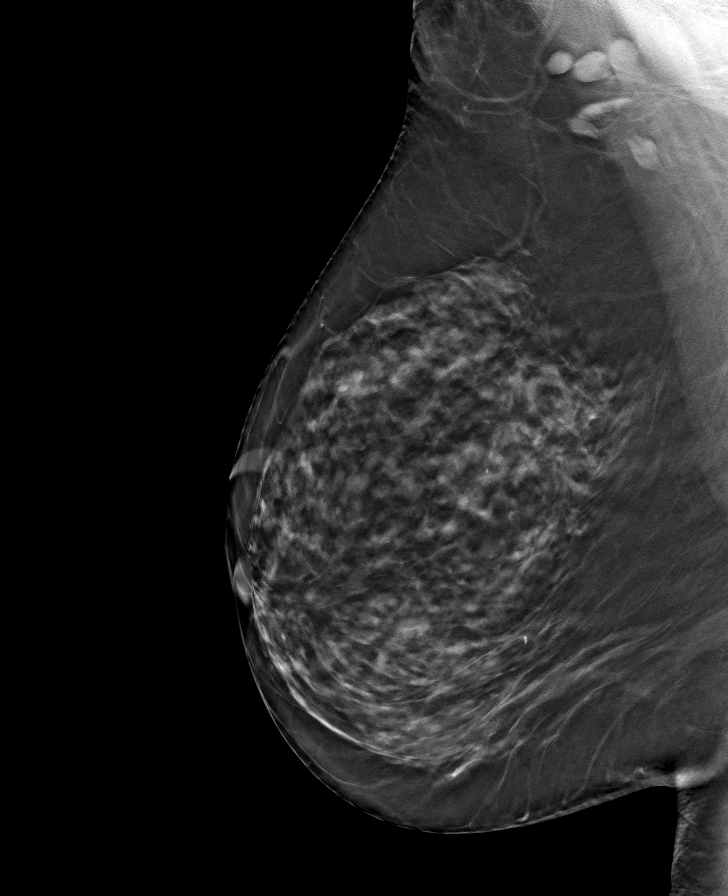

[R CC tomo · tomo slice 38/75.0]
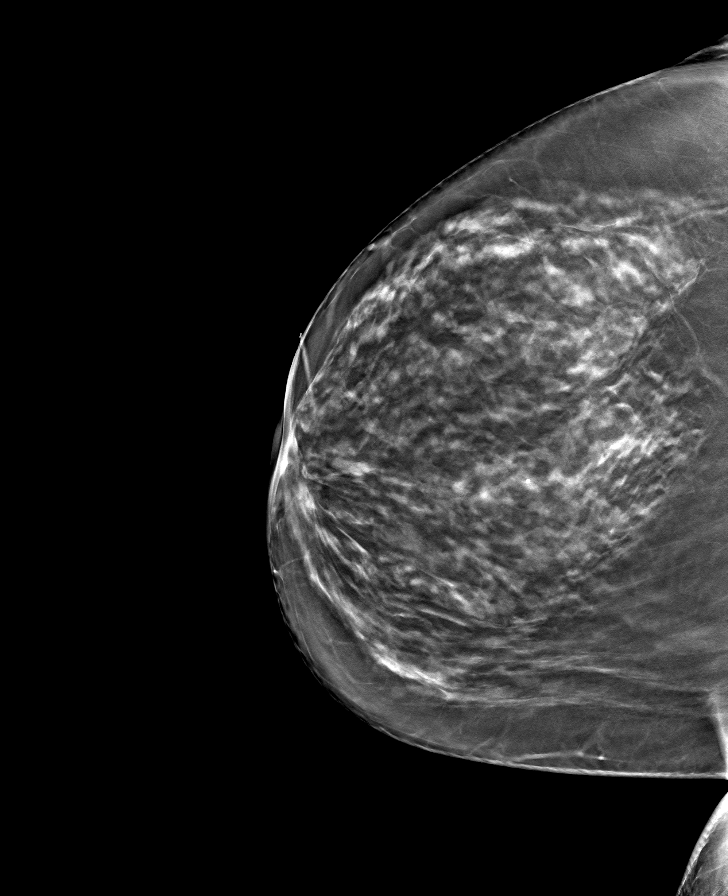

[L CC tomo · tomo slice 38/75.0]
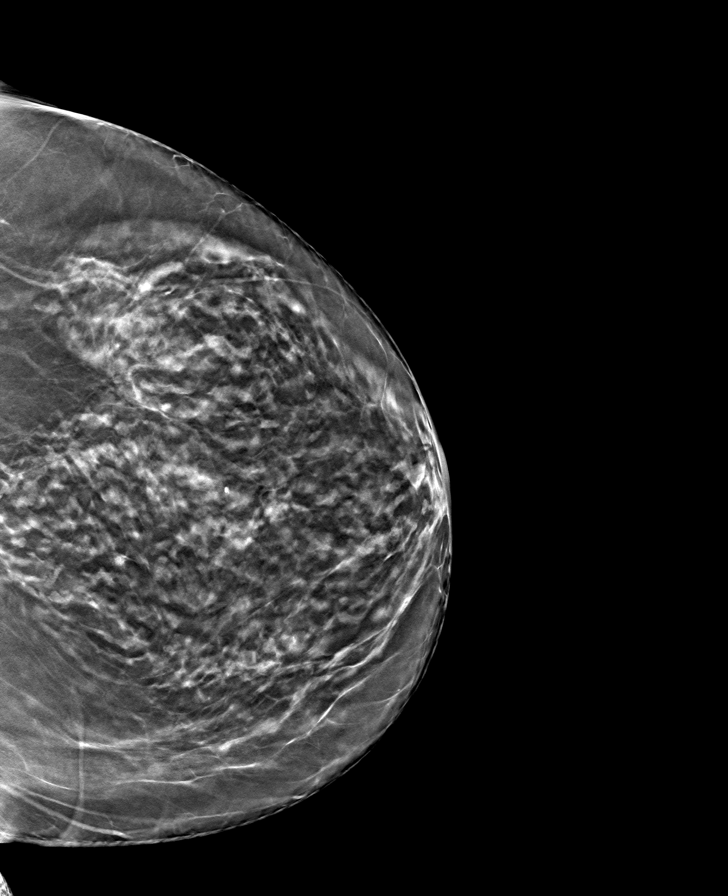

[8 of 24 positions shown; findings below may reference images not displayed]

ACR Breast Density Category c: The breast tissue is heterogeneously
dense, which may obscure small masses.
FINDINGS: There are no findings suspicious for malignancy. Images were
processed with CAD.
IMPRESSION: No mammographic evidence of malignancy. A result letter of this
screening mammogram will be mailed directly to the patient.

RECOMMENDATION:
Screening mammogram in one year. (Code:[id number]-[id number])

BI-RADS CATEGORY  1: Negative.

## 2021-12-06 ENCOUNTER — Other Ambulatory Visit: Payer: Self-pay | Admitting: Orthopedic Surgery

## 2021-12-06 ENCOUNTER — Other Ambulatory Visit: Payer: Self-pay | Admitting: Internal Medicine

## 2021-12-06 DIAGNOSIS — Z1231 Encounter for screening mammogram for malignant neoplasm of breast: Secondary | ICD-10-CM

## 2021-12-07 ENCOUNTER — Ambulatory Visit: Payer: BLUE CROSS/BLUE SHIELD

## 2022-03-10 ENCOUNTER — Other Ambulatory Visit: Payer: Self-pay | Admitting: Internal Medicine
